# Patient Record
Sex: Female | Born: 1975 | Race: Black or African American | Hispanic: No | Marital: Married | Smoking: Never smoker
Health system: Southern US, Community
[De-identification: ages and names within clinical notes are randomized; demographics above are authoritative.]

## PROBLEM LIST (undated history)

## (undated) DIAGNOSIS — R011 Cardiac murmur, unspecified: Secondary | ICD-10-CM

## (undated) DIAGNOSIS — D569 Thalassemia, unspecified: Secondary | ICD-10-CM

## (undated) DIAGNOSIS — N200 Calculus of kidney: Secondary | ICD-10-CM

## (undated) DIAGNOSIS — E063 Autoimmune thyroiditis: Secondary | ICD-10-CM

---

## 2007-10-01 LAB — CONVERTED CEMR LAB: Pap Smear: NORMAL

## 2009-01-13 ENCOUNTER — Ambulatory Visit: Payer: Self-pay | Admitting: Internal Medicine

## 2009-01-13 DIAGNOSIS — I10 Essential (primary) hypertension: Secondary | ICD-10-CM | POA: Insufficient documentation

## 2009-01-13 DIAGNOSIS — E739 Lactose intolerance, unspecified: Secondary | ICD-10-CM

## 2009-01-13 DIAGNOSIS — D649 Anemia, unspecified: Secondary | ICD-10-CM

## 2009-01-13 DIAGNOSIS — F329 Major depressive disorder, single episode, unspecified: Secondary | ICD-10-CM

## 2009-01-13 DIAGNOSIS — E039 Hypothyroidism, unspecified: Secondary | ICD-10-CM | POA: Insufficient documentation

## 2009-01-13 DIAGNOSIS — Z87442 Personal history of urinary calculi: Secondary | ICD-10-CM

## 2009-01-13 LAB — CONVERTED CEMR LAB
ALT: 15 units/L (ref 0–35)
AST: 20 units/L (ref 0–37)
Albumin: 3.8 g/dL (ref 3.5–5.2)
Alkaline Phosphatase: 66 units/L (ref 39–117)
BUN: 3 mg/dL — ABNORMAL LOW (ref 6–23)
Basophils Absolute: 0 10*3/uL (ref 0.0–0.1)
Basophils Relative: 0.6 % (ref 0.0–3.0)
Bilirubin Urine: NEGATIVE
Bilirubin, Direct: 0.1 mg/dL (ref 0.0–0.3)
CO2: 27 meq/L (ref 19–32)
Calcium: 8.9 mg/dL (ref 8.4–10.5)
Chloride: 105 meq/L (ref 96–112)
Cholesterol: 204 mg/dL — ABNORMAL HIGH (ref 0–200)
Creatinine, Ser: 0.5 mg/dL (ref 0.4–1.2)
Direct LDL: 123.3 mg/dL
Eosinophils Absolute: 0.1 10*3/uL (ref 0.0–0.7)
Eosinophils Relative: 2.2 % (ref 0.0–5.0)
GFR calc non Af Amer: 152.63 mL/min (ref >60)
Glucose, Bld: 87 mg/dL (ref 70–99)
HCT: 37.9 % (ref 36.0–46.0)
HDL: 58.6 mg/dL (ref >39.00)
Hemoglobin: 12.4 g/dL (ref 12.0–15.0)
Ketones, ur: NEGATIVE mg/dL
Leukocytes, UA: NEGATIVE
Lymphocytes Relative: 33.2 % (ref 12.0–46.0)
Lymphs Abs: 2 10*3/uL (ref 0.7–4.0)
MCHC: 32.8 g/dL (ref 30.0–36.0)
MCV: 71.2 fL — ABNORMAL LOW (ref 78.0–100.0)
Monocytes Absolute: 0.4 10*3/uL (ref 0.1–1.0)
Monocytes Relative: 6.4 % (ref 3.0–12.0)
Neutro Abs: 3.5 10*3/uL (ref 1.4–7.7)
Neutrophils Relative %: 57.6 % (ref 43.0–77.0)
Nitrite: NEGATIVE
Platelets: 251 10*3/uL (ref 150.0–400.0)
Potassium: 3.6 meq/L (ref 3.5–5.1)
RBC: 5.32 M/uL — ABNORMAL HIGH (ref 3.87–5.11)
RDW: 13.8 % (ref 11.5–14.6)
Sodium: 142 meq/L (ref 135–145)
Specific Gravity, Urine: 1.01 (ref 1.000–1.030)
TSH: 0.31 microintl units/mL — ABNORMAL LOW (ref 0.35–5.50)
Total Bilirubin: 0.8 mg/dL (ref 0.3–1.2)
Total CHOL/HDL Ratio: 3
Total Protein, Urine: NEGATIVE mg/dL
Total Protein: 7.4 g/dL (ref 6.0–8.3)
Triglycerides: 95 mg/dL (ref 0.0–149.0)
Urine Glucose: NEGATIVE mg/dL
Urobilinogen, UA: 0.2 (ref 0.0–1.0)
VLDL: 19 mg/dL (ref 0.0–40.0)
WBC: 6 10*3/uL (ref 4.5–10.5)
pH: 7.5 (ref 5.0–8.0)

## 2009-01-15 ENCOUNTER — Telehealth: Payer: Self-pay | Admitting: Internal Medicine

## 2009-02-02 ENCOUNTER — Ambulatory Visit: Payer: Self-pay | Admitting: Endocrinology

## 2009-02-02 DIAGNOSIS — E042 Nontoxic multinodular goiter: Secondary | ICD-10-CM

## 2009-02-02 DIAGNOSIS — E0590 Thyrotoxicosis, unspecified without thyrotoxic crisis or storm: Secondary | ICD-10-CM | POA: Insufficient documentation

## 2009-02-24 ENCOUNTER — Telehealth (INDEPENDENT_AMBULATORY_CARE_PROVIDER_SITE_OTHER): Payer: Self-pay | Admitting: *Deleted

## 2009-02-25 ENCOUNTER — Encounter (INDEPENDENT_AMBULATORY_CARE_PROVIDER_SITE_OTHER): Payer: Self-pay | Admitting: *Deleted

## 2010-02-05 ENCOUNTER — Inpatient Hospital Stay (HOSPITAL_COMMUNITY)
Admission: AD | Admit: 2010-02-05 | Discharge: 2010-02-05 | Payer: Self-pay | Source: Home / Self Care | Attending: Obstetrics and Gynecology | Admitting: Obstetrics and Gynecology

## 2010-02-14 LAB — CBC
HCT: 36.9 % (ref 36.0–46.0)
Hemoglobin: 11.5 g/dL — ABNORMAL LOW (ref 12.0–15.0)
MCH: 21.2 pg — ABNORMAL LOW (ref 26.0–34.0)
MCHC: 31.2 g/dL (ref 30.0–36.0)
MCV: 68 fL — ABNORMAL LOW (ref 78.0–100.0)
Platelets: 274 10*3/uL (ref 150–400)
RBC: 5.43 MIL/uL — ABNORMAL HIGH (ref 3.87–5.11)
RDW: 14.7 % (ref 11.5–15.5)
WBC: 8.5 10*3/uL (ref 4.0–10.5)

## 2010-02-14 LAB — URINALYSIS, ROUTINE W REFLEX MICROSCOPIC
Bilirubin Urine: NEGATIVE
Ketones, ur: NEGATIVE mg/dL
Leukocytes, UA: NEGATIVE
Nitrite: NEGATIVE
Protein, ur: NEGATIVE mg/dL
Specific Gravity, Urine: 1.015 (ref 1.005–1.030)
Urine Glucose, Fasting: NEGATIVE mg/dL
Urobilinogen, UA: 0.2 mg/dL (ref 0.0–1.0)
pH: 7.5 (ref 5.0–8.0)

## 2010-02-14 LAB — URINE MICROSCOPIC-ADD ON

## 2010-02-14 LAB — HCG, QUANTITATIVE, PREGNANCY: hCG, Beta Chain, Quant, S: 45836 m[IU]/mL — ABNORMAL HIGH (ref <5)

## 2010-02-14 LAB — GC/CHLAMYDIA PROBE AMP, GENITAL
Chlamydia, DNA Probe: NEGATIVE
GC Probe Amp, Genital: NEGATIVE

## 2010-02-14 LAB — ABO/RH: ABO/RH(D): O POS

## 2010-02-14 LAB — WET PREP, GENITAL
Trich, Wet Prep: NONE SEEN
Yeast Wet Prep HPF POC: NONE SEEN

## 2010-02-14 LAB — POCT PREGNANCY, URINE: Preg Test, Ur: POSITIVE

## 2010-03-01 NOTE — Progress Notes (Signed)
Summary: No-show  Phone Note Other Incoming   Caller: [facility] Radiology [phone] Summary of Call: pt had appt to have Nuclear Medicine Thyroid 24hr Uptake done 02/23/2009 but no-showed. would you like me call pt to re-schedule? Initial call taken by: Margaret Pyle, CMA,  February 24, 2009 4:18 PM  Follow-up for Phone Call        yes, please reschedule Follow-up by: Minus Breeding MD,  February 24, 2009 7:38 PM  Additional Follow-up for Phone Call Additional follow up Details #1::        Appt schedule for jan 31@1 :00mon and tue  feb 1 @ 1:00 can not leave a msg on pt phone  [phone] mail box is full mail out letter  Additional Follow-up by: Shelbie Proctor,  February 25, 2009 8:28 AM

## 2010-03-01 NOTE — Assessment & Plan Note (Signed)
Summary: NEW ENDO CON/ HYPOTHYROID/NWS   Vital Signs:  Patient profile:   35 year old female Height:      62 inches (157.48 cm) Weight:      152 pounds (69.09 kg) O2 Sat:      99 % on Room air Temp:     97.1 degrees F (36.17 degrees C) oral Pulse rate:   76 / minute BP sitting:   118 / 80  (left arm) Cuff size:   regular  Vitals Entered By: Josph Macho CMA (February 02, 2009 2:13 PM)  O2 Flow:  Room air CC: New Endo: Hypothyroidism/ CF Is Patient Diabetic? No   CC:  New Endo: Hypothyroidism/ CF.  History of Present Illness: pt states she was dx'ed with hyperthyroidism and a goiter in 2006, when she live in [place].  she declines i-131 then.   symptomatically, pt states few years of sensation of a slight swelling of the neck, and intermittent associated pain.    Current Medications (verified): 1)  None  Allergies (verified): No Known Drug Allergies  Past History:  Past Medical History: Last updated: 01/13/2009 Anemia-NOS - thallasemia beta minor Depression Hypertension Nephrolithiasis, hx of hashimoto's thyroidisits/goiter glucose intolerance ? arythmia  Family History: Reviewed history from 01/13/2009 and no changes required. mother with DJD, elevated chol father with HTN and elevated cholesterol grandparent s with DJD, and HTN m-aunt with DM, HTN sister ded with lymphoma at 21yo sister with epilepsy aunt had a goiter.  Social History: Reviewed history from 01/13/2009 and no changes required. Married 3 children work - Public house manager , working as Social research officer, government  Never Smoked Alcohol use-yes  Review of Systems       The patient complains of weight gain.         denies double vision, sob, diarrhea, polyuria, muscle weakness, excessive diaphoresis, numbness, hypoglycemia, bruising, rhinorrhea.  she has intermittent headaches and deepening of the voice.  she has intermittent palpitations, and tremor of the hands.  she has slight anxiety and  depression.   Physical Exam  General:  normal appearance.   Head:  head: no deformity eyes: no periorbital swelling, no proptosis external nose and ears are normal mouth: no lesion seen  Neck:  there is an approx 15 mm left upper lobe thyroid nodule.  hte rest of the thyroid appears to be normal. Lungs:  Clear to auscultation bilaterally. Normal respiratory effort.  Heart:  Regular rate and rhythm without murmurs or gallops noted. Normal S1,S2.   Msk:  muscle bulk and strength are grossly normal.  no obvious joint swelling.  gait is normal and steady  Extremities:  no deformity trace right pedal edema and trace left pedal edema.   Neurologic:  cn 2-12 grossly intact.   readily moves all 4's.   sensation is intact to touch on all 4's Skin:  normal texture and temp.  no rash.  not diaphoretic  Cervical Nodes:  No significant adenopathy.  Psych:  Alert and cooperative; normal mood and affect; normal attention span and concentration.     Impression & Recommendations:  Problem # 1:  GOITER, MULTINODULAR (ICD-241.1) usually hereditary  Problem # 2:  HYPERTHYROIDISM (ICD-242.90) mild.  due to #1.  Problem # 3:  neck pain not thyroid-related  Other Orders: Radiology Referral (Radiology) Radiology Referral (Radiology) Consultation Level IV [phone])  Patient Instructions: 1)  we discussed the natural hx of nodular thyroid disease ("lumpy thyroid"), which is usually the slow development of hyperthyroidism (overactive thyroid). 2)  checdk  ultrasound and thyroid scan (a special type of thyroid x-ray). 3)  tests are being ordered for you today.  a few days after the test(s), please call [phone] to hear your test results.

## 2010-03-01 NOTE — Letter (Signed)
Summary: [facility] Consult Scheduled Letter  [facility]  [address] [place], [place] [address]   Phone: [phone]  Fax: [phone]      02/25/2009 MRN: [id number]  [facility] MUTHUI-GRAINGER [address] [place], [place]  [address]    Dear Ms. MUTHUI-GRAINGER,      We have scheduled an appointment for you. At the recommendation of Dr.John, we have scheduled you a consult with [facility]) Thyroid neuclear med uptake and scan on January 31&Febuary 1 Both appt at 1:00pm Their phone number is [phone].If this appointment day and time is not convenient for you, please feel free to call the office of the doctor you are being referred to at the number listed above and reschedule the appointment.  Radiology 1st floor  [address] [place], [place] [address]  Thank you,  Patient Care Coordinator [facility]

## 2011-07-12 ENCOUNTER — Other Ambulatory Visit: Payer: Self-pay | Admitting: Endocrinology

## 2011-07-12 DIAGNOSIS — E0590 Thyrotoxicosis, unspecified without thyrotoxic crisis or storm: Secondary | ICD-10-CM

## 2011-07-21 ENCOUNTER — Ambulatory Visit
Admission: RE | Admit: 2011-07-21 | Discharge: 2011-07-21 | Disposition: A | Discharge status: Home / Self Care | Payer: Medicaid Other | Source: Ambulatory Visit | Attending: Endocrinology | Admitting: Endocrinology

## 2011-07-21 DIAGNOSIS — E0590 Thyrotoxicosis, unspecified without thyrotoxic crisis or storm: Secondary | ICD-10-CM

## 2012-02-11 ENCOUNTER — Encounter (HOSPITAL_COMMUNITY): Payer: Self-pay | Admitting: Emergency Medicine

## 2012-02-11 ENCOUNTER — Emergency Department (INDEPENDENT_AMBULATORY_CARE_PROVIDER_SITE_OTHER)
Admission: EM | Admit: 2012-02-11 | Discharge: 2012-02-11 | Disposition: A | Discharge status: Home / Self Care | Payer: Self-pay | Source: Home / Self Care | Attending: Emergency Medicine | Admitting: Emergency Medicine

## 2012-02-11 DIAGNOSIS — K047 Periapical abscess without sinus: Secondary | ICD-10-CM

## 2012-02-11 HISTORY — DX: Cardiac murmur, unspecified: R01.1

## 2012-02-11 HISTORY — DX: Autoimmune thyroiditis: E06.3

## 2012-02-11 HISTORY — DX: Thalassemia, unspecified: D56.9

## 2012-02-11 HISTORY — DX: Calculus of kidney: N20.0

## 2012-02-11 MED ORDER — CLINDAMYCIN HCL 300 MG PO CAPS: 300.0000 mg | ORAL_CAPSULE | Freq: Four times a day (QID) | ORAL | Status: DC

## 2012-02-11 MED ORDER — HYDROCODONE-ACETAMINOPHEN 5-325 MG PO TABS: ORAL_TABLET | ORAL | Status: DC

## 2012-02-11 NOTE — ED Provider Notes (Signed)
Chief Complaint  Patient presents with  . Jaw Pain    History of Present Illness:   The patient is a 37 year old female who has had a five-day history of pain in her left, lower first molar and swelling of her jaw. The tooth is cracked previously. She's not been to see a dentist about this. There's been no purulent drainage. There is swelling of the gingiva and of the jaw externally. She denies any fever, chills, or sweats. She's had no difficulty breathing or swallowing. It does hurt to chew on that side. She denies any chest pain or shortness of breath. There is no swelling in the neck.  Review of Systems:  Other than noted above, the patient denies any of the following symptoms: Systemic:  No fever, chills, sweats or weight loss. ENT:  No headache, ear ache, sore throat, nasal congestion, facial pain, or swelling. Lymphatic:  No adenopathy. Lungs:  No coughing, wheezing or shortness of breath.  PMFSH:  Past medical history, family history, social history, meds, and allergies were reviewed.  Physical Exam:   Vital signs:  BP 147/98  Pulse 91  Temp 99.3 F (37.4 C) (Oral)  Resp 19  SpO2 100% General:  Alert, oriented, in no distress. ENT:  TMs and canals normal.  Nasal mucosa normal. Mouth exam:  She has moderate swelling externally of the left lower jaw which is tender to palpation. Entirely her teeth are in fairly good repair with the exception of the left, lower first molar which is partially cracked indicated. There is swelling of the gingiva at the gingivobuccal sulcus. There is no collection of pus. No swelling of the floor the mouth. No swelling of the tongue the pharynx is clear and airway is widely patent. Neck:  No swelling or adenopathy. Lungs:  Breath sounds clear and equal bilaterally.  No wheezes, rales or rhonchi. Heart:  Regular rhythm.  No gallops or murmers. Skin:  Clear, warm and dry.  Assessment:  The encounter diagnosis was Dental abscess.  Plan:   1.  The  following meds were prescribed:   New Prescriptions   CLINDAMYCIN (CLEOCIN) 300 MG CAPSULE    Take 1 capsule (300 mg total) by mouth 4 (four) times daily.   HYDROCODONE-ACETAMINOPHEN (NORCO/VICODIN) 5-325 MG PER TABLET    1 to 2 tabs every 4 to 6 hours as needed for pain.   2.  The patient was instructed in symptomatic care and handouts were given. 3.  The patient was told to return if becoming worse in any way, if no better in 3 or 4 days, and given some red flag symptoms that would indicate earlier return, especially difficulty breathing. 4.  The patient was told to follow up with a dentist as soon as possible.    Reuben Likes, MD 02/11/12 [phone]

## 2012-02-11 NOTE — ED Notes (Signed)
Provided warm blanket for face and body

## 2012-02-11 NOTE — ED Notes (Signed)
Patient currently is breast feeding

## 2012-02-11 NOTE — ED Notes (Signed)
Reports left jaw pain, swelling that started 3 days ago.  No tooth pain, but is aware of a cracked tooth on bottom, left and feels it is related.

## 2012-03-16 ENCOUNTER — Other Ambulatory Visit: Payer: Self-pay

## 2012-07-31 ENCOUNTER — Other Ambulatory Visit: Payer: Self-pay | Admitting: Endocrinology

## 2012-07-31 DIAGNOSIS — E049 Nontoxic goiter, unspecified: Secondary | ICD-10-CM

## 2012-08-12 ENCOUNTER — Ambulatory Visit
Admission: RE | Admit: 2012-08-12 | Discharge: 2012-08-12 | Disposition: A | Discharge status: Home / Self Care | Payer: Medicaid Other | Source: Ambulatory Visit | Attending: Endocrinology | Admitting: Endocrinology

## 2012-08-12 DIAGNOSIS — E049 Nontoxic goiter, unspecified: Secondary | ICD-10-CM

## 2012-08-15 ENCOUNTER — Other Ambulatory Visit: Payer: Self-pay | Admitting: Endocrinology

## 2012-08-15 DIAGNOSIS — E041 Nontoxic single thyroid nodule: Secondary | ICD-10-CM

## 2012-08-22 ENCOUNTER — Other Ambulatory Visit (HOSPITAL_COMMUNITY)
Admission: RE | Admit: 2012-08-22 | Discharge: 2012-08-22 | Disposition: A | Discharge status: Home / Self Care | Payer: Medicaid Other | Source: Ambulatory Visit | Attending: Endocrinology | Admitting: Endocrinology

## 2012-08-22 ENCOUNTER — Ambulatory Visit
Admission: RE | Admit: 2012-08-22 | Discharge: 2012-08-22 | Disposition: A | Discharge status: Home / Self Care | Payer: Medicaid Other | Source: Ambulatory Visit | Attending: Endocrinology | Admitting: Endocrinology

## 2012-08-22 DIAGNOSIS — E049 Nontoxic goiter, unspecified: Secondary | ICD-10-CM | POA: Insufficient documentation

## 2012-08-22 DIAGNOSIS — E041 Nontoxic single thyroid nodule: Secondary | ICD-10-CM

## 2012-12-05 ENCOUNTER — Other Ambulatory Visit: Payer: Self-pay

## 2016-07-04 ENCOUNTER — Telehealth: Payer: Self-pay | Admitting: Internal Medicine

## 2016-07-04 NOTE — Telephone Encounter (Signed)
Called to cancel referral for patient to come to endocrinology. No further action needed.

## 2016-10-27 ENCOUNTER — Ambulatory Visit
Admission: RE | Admit: 2016-10-27 | Discharge: 2016-10-27 | Disposition: A | Discharge status: Home / Self Care | Payer: PRIVATE HEALTH INSURANCE | Source: Ambulatory Visit | Attending: Family Medicine | Admitting: Family Medicine

## 2016-10-27 ENCOUNTER — Other Ambulatory Visit: Payer: Self-pay | Admitting: Family Medicine

## 2016-10-27 DIAGNOSIS — M542 Cervicalgia: Secondary | ICD-10-CM

## 2016-11-02 ENCOUNTER — Other Ambulatory Visit: Payer: Self-pay | Admitting: Endocrinology

## 2016-11-02 DIAGNOSIS — E041 Nontoxic single thyroid nodule: Secondary | ICD-10-CM

## 2016-11-10 ENCOUNTER — Other Ambulatory Visit: Payer: PRIVATE HEALTH INSURANCE

## 2016-11-13 ENCOUNTER — Ambulatory Visit
Admission: RE | Admit: 2016-11-13 | Discharge: 2016-11-13 | Disposition: A | Discharge status: Home / Self Care | Payer: PRIVATE HEALTH INSURANCE | Source: Ambulatory Visit | Attending: Endocrinology | Admitting: Endocrinology

## 2016-11-13 DIAGNOSIS — E041 Nontoxic single thyroid nodule: Secondary | ICD-10-CM

## 2016-12-30 ENCOUNTER — Emergency Department (HOSPITAL_COMMUNITY)
Admission: EM | Admit: 2016-12-30 | Discharge: 2016-12-30 | Disposition: A | Discharge status: Home / Self Care | Payer: PRIVATE HEALTH INSURANCE | Attending: Emergency Medicine | Admitting: Emergency Medicine

## 2016-12-30 ENCOUNTER — Encounter (HOSPITAL_COMMUNITY): Payer: Self-pay | Admitting: Nurse Practitioner

## 2016-12-30 ENCOUNTER — Emergency Department (HOSPITAL_COMMUNITY): Payer: PRIVATE HEALTH INSURANCE

## 2016-12-30 DIAGNOSIS — I10 Essential (primary) hypertension: Secondary | ICD-10-CM | POA: Diagnosis not present

## 2016-12-30 DIAGNOSIS — Z79899 Other long term (current) drug therapy: Secondary | ICD-10-CM | POA: Insufficient documentation

## 2016-12-30 DIAGNOSIS — R109 Unspecified abdominal pain: Secondary | ICD-10-CM

## 2016-12-30 DIAGNOSIS — E039 Hypothyroidism, unspecified: Secondary | ICD-10-CM | POA: Diagnosis not present

## 2016-12-30 DIAGNOSIS — R338 Other retention of urine: Secondary | ICD-10-CM | POA: Diagnosis not present

## 2016-12-30 DIAGNOSIS — R1012 Left upper quadrant pain: Secondary | ICD-10-CM | POA: Diagnosis present

## 2016-12-30 LAB — URINALYSIS, ROUTINE W REFLEX MICROSCOPIC
Bilirubin Urine: NEGATIVE
Glucose, UA: NEGATIVE mg/dL
Hgb urine dipstick: NEGATIVE
Ketones, ur: NEGATIVE mg/dL
Leukocytes, UA: NEGATIVE
Nitrite: NEGATIVE
Protein, ur: NEGATIVE mg/dL
Specific Gravity, Urine: 1.014 (ref 1.005–1.030)
pH: 6 (ref 5.0–8.0)

## 2016-12-30 MED ORDER — KETOROLAC TROMETHAMINE 30 MG/ML IJ SOLN
30.0000 mg | Freq: Once | INTRAMUSCULAR | Status: AC
  Administered 2016-12-30: 30 mg via INTRAVENOUS
  Filled 2016-12-30: qty 1

## 2016-12-30 NOTE — ED Notes (Signed)
Pt unable to wait for bladder scan d/t pain.  Pt with hx of kidney stones.

## 2016-12-30 NOTE — ED Provider Notes (Signed)
[facility]-EMERGENCY DEPT Provider Note   CSN: [id number] Arrival date & time: 12/30/16  0203  Time seen 03:50 AM  History   Chief Complaint Chief Complaint  Patient presents with  . Post-op Problem  . Urinary Retention    HPI Kristy Garner is a 41 y.o. female.  HPI patient reports she had a total vaginal hysterectomy, and repair of a cystocele, rectocele, and uterine prolapse done on [facility].  She reports she was discharged about 3:30 PM in the afternoon of November 30.  She states that at that time she noted she was just dribbling urine and felt like she was not emptying her bladder.  She states she then could not urinate at all.  She also is complaining of severe left flank pain that was not helped with Dilaudid oral pills.  She states she did have abdominal bloating but denies nausea, vomiting, or hematuria.  She has never had to have a catheter before.  She states she has had multiple kidney stones that she has passed before.  She thinks the flank pain may be a kidney stone.  She states she routinely passes them and passed to within the past few months.  [facility].  PCP Hoyt KochYousef, Deema, MD GYN Dr Lavella Hammockatherine Matthews [facility]  Past Medical History:  Diagnosis Date  . Hashimoto's thyroiditis   . Heart murmur   . Kidney stones   . Thalassemia     Patient Active Problem List   Diagnosis Date Noted  . GOITER, MULTINODULAR 02/02/2009  . HYPERTHYROIDISM 02/02/2009  . HYPOTHYROIDISM 01/13/2009  . GLUCOSE INTOLERANCE 01/13/2009  . ANEMIA-NOS 01/13/2009  . DEPRESSION 01/13/2009  . HYPERTENSION 01/13/2009  . NEPHROLITHIASIS, HX OF 01/13/2009    History reviewed. No pertinent surgical history.  OB History    No data available       Home Medications    Prior to Admission medications   Medication Sig Start Date End Date Taking? Authorizing Provider  acetaminophen  (TYLENOL) 500 MG tablet Take 1,000 mg by mouth every 6 (six) hours as needed for mild pain, moderate pain or headache.   Yes [provider]  docusate sodium (COLACE) 100 MG capsule Take 100 mg by mouth 2 (two) times daily.   Yes [provider]  HYDROmorphone (DILAUDID) 2 MG tablet Take 2 mg by mouth every 4 (four) hours as needed for moderate pain or severe pain.   Yes [provider]  ibuprofen (ADVIL,MOTRIN) 800 MG tablet Take 800 mg by mouth every 8 (eight) hours as needed for headache, mild pain or moderate pain.   Yes [provider]  ondansetron (ZOFRAN-ODT) 4 MG disintegrating tablet Take 4 mg by mouth every 8 (eight) hours as needed for nausea or vomiting.   Yes [provider]    Family History History reviewed. No pertinent family history.  Social History Social History   Tobacco Use  . Smoking status: Never Smoker  . Smokeless tobacco: Never Used  Substance Use Topics  . Alcohol use: No  . Drug use: No  Pt is  RN at [employer]   Allergies   Ivp dye [iodinated diagnostic agents] and Penicillins   Review of Systems Review of Systems  All other systems reviewed and are negative.    Physical Exam Updated Vital Signs BP (!) 124/104 (BP Location: Right Arm)   Pulse 93   Temp 98.6 F (37 C) (Oral)  Resp 18   Ht 5\' 2"  (1.575 m)   Wt 60.3 kg (133 lb)   SpO2 97%   BMI 24.33 kg/m   Physical Exam  Constitutional: She is oriented to person, place, and time. She appears well-developed and well-nourished.  HENT:  Head: Normocephalic and atraumatic.  Right Ear: External ear normal.  Left Ear: External ear normal.  Nose: Nose normal.  Eyes: Conjunctivae and EOM are normal.  Neck: Normal range of motion.  Cardiovascular: Normal rate.  Pulmonary/Chest: Effort normal. No respiratory distress.  Abdominal: Soft.  Patient reports her abdomen feels much better after Foley placement.  Her abdomen was not palpated to extensively due to  her recent surgery.  Genitourinary:  Genitourinary Comments: Patient has left CVA tenderness.  Musculoskeletal: Normal range of motion. She exhibits no deformity.  Neurological: She is alert and oriented to person, place, and time. No cranial nerve deficit.  Skin: Skin is warm and dry. No erythema.  Psychiatric: She has a normal mood and affect. Her behavior is normal.  Nursing note and vitals reviewed.    ED Treatments / Results  Labs (all labs ordered are listed, but only abnormal results are displayed) Results for orders placed or performed during the hospital encounter of 12/30/16  Urinalysis, Routine w reflex microscopic  Result Value Ref Range   Color, Urine YELLOW YELLOW   APPearance CLEAR CLEAR   Specific Gravity, Urine 1.014 1.005 - 1.030   pH 6.0 5.0 - 8.0   Glucose, UA NEGATIVE NEGATIVE mg/dL   Hgb urine dipstick NEGATIVE NEGATIVE   Bilirubin Urine NEGATIVE NEGATIVE   Ketones, ur NEGATIVE NEGATIVE mg/dL   Protein, ur NEGATIVE NEGATIVE mg/dL   Nitrite NEGATIVE NEGATIVE   Leukocytes, UA NEGATIVE NEGATIVE   Laboratory interpretation all normal     EKG  EKG Interpretation None       Radiology Ct Renal Stone Study  Result Date: 12/30/2016 CLINICAL DATA:  Left flank pain. Hysterectomy with bladder tack 2 days prior. EXAM: CT ABDOMEN AND PELVIS WITHOUT CONTRAST TECHNIQUE: Multidetector CT imaging of the abdomen and pelvis was performed following the standard protocol without IV contrast. COMPARISON:  None. FINDINGS: Lower chest: The lung bases are clear. Hepatobiliary: No focal hepatic lesion allowing for lack contrast. Gallbladder physiologically distended, no calcified stone. No biliary dilatation. Pancreas: No ductal dilatation or inflammation. Spleen: Normal in size without focal abnormality. Adrenals/Urinary Tract: No adrenal nodule. No hydronephrosis or perinephric edema. Bilateral nonobstructing nephrolithiasis, largest stone on the left measures 8 mm, largest on  the right 5 mm. Simple cyst in the lower right kidney. Ureters are decompressed without stones along the course. Urinary bladder is decompressed by Foley catheter. Stomach/Bowel: Stomach is nondistended. No bowel wall thickening, inflammation or obstruction. Appendix is air-filled without periappendiceal inflammation. Moderate stool in the right colon, small volume of stool distally. Vascular/Lymphatic: No enlarged abdominal or pelvic lymph nodes. Normal caliber abdominal aorta. Reproductive: Post hysterectomy. Peroneal air related to recent hysterectomy. Minimal soft tissue prominence of the vaginal cuff. No evidence of pelvic abscess. Other: Air within the subcutaneous tissues of the anterior lower pelvis secondary to recent surgery. Small amount of free fluid in the pelvis without evidence of abscess. No tracking soft tissue air. Small umbilical hernia containing fat and minimal small bowel, no inflammation. Musculoskeletal: There are no acute or suspicious osseous abnormalities. IMPRESSION: 1. Bilateral nonobstructing renal stones without hydronephrosis or obstructive uropathy. Urinary bladder decompressed by Foley catheter. 2. Post recent hysterectomy with expected postsurgical change in the pelvis  and soft tissues. No evidence of abscess. Electronically Signed   By: Rubye OaksMelanie  Ehinger M.D.   On: 12/30/2016 05:27    Procedures Procedures (including critical care time)  Medications Ordered in ED Medications  ketorolac (TORADOL) 30 MG/ML injection 30 mg (30 mg Intravenous Given 12/30/16 0500)     Initial Impression / Assessment and Plan / ED Course  I have reviewed the triage vital signs and the nursing notes.  Pertinent labs & imaging results that were available during my care of the patient were reviewed by me and considered in my medical decision making (see chart for details).    Nurse reports she was unable to get a bladder scan done before patient reported she could not wait any longer.  A  Foley catheter was inserted and she had approximately 800 cc of urinary output.  Patient had relief of her lower abdominal discomfort other than her postsurgical pain.  She also was complaining of a lot of left flank pain and has a history of passing kidney stones.  CT renal was done and patient was given Toradol 30 mg IV.  Recheck at time of discharge patient is resting comfortably, she was given her test results.  She states her pain is improved after the Toradol.  She was discharged home with her Foley catheter.  She states her surgeons told her to follow-up with them later today by phone.  They are going to try to work her into the office hopefully on Monday.  Patient has pain medication to take at home.  Final Clinical Impressions(s) / ED Diagnoses   Final diagnoses:  Acute urinary retention  Left flank pain    Plan discharge  Devoria AlbeIva Jeff Frieden, MD, [facility]    Chiyoko Torrico, MD 12/30/16 [phone]

## 2016-12-30 NOTE — Discharge Instructions (Signed)
Recheck if your catheter stops draining or you have any further problems. [facility], if they want you to see a local urologist, you can call your Urologist, Dr Sabino GasserMullins, to get follow up.  You CT scan shows you do have some small renal stones, but you are not passing one tonight. Try ice and heat for comfort. Ask your doctors if you can take ibuprofen 600 mg 4 times a day for pain.

## 2016-12-30 NOTE — ED Triage Notes (Signed)
Pt reports she had an complete hysterectomy on Thursday,  She noticed today she has been having difficulty voiding but for the last 6 hours, she has experiencing severe pain and unable to void. She was advised to come for foley placement to help drain her bladder and offer some relief.

## 2016-12-30 NOTE — ED Notes (Signed)
IV attempt x2 without success.

## 2016-12-30 NOTE — ED Notes (Signed)
Pt requesting to wear large drainage bag home & take the leg bag to change out later.  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

## 2018-01-02 IMAGING — US US THYROID
1 series · 13 of 25 positions shown · non-contrast
Comparison: 08/12/2012;

CLINICAL DATA: Prior ultrasound follow-up. Follow-up thyroid
nodule. History of right-sided thyroid nodule fine-needle aspiration
on 08/22/2012

EXAM:
THYROID ULTRASOUND
TECHNIQUE: Ultrasound examination of the thyroid gland and adjacent soft
tissues was performed.

[Series 1: us thyroid · 0.06mm/px · 13 of 46 slices shown]
[im 1/46]
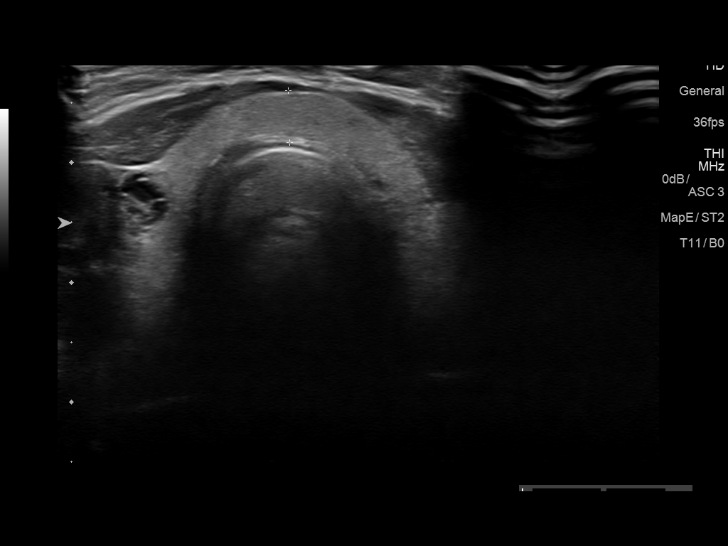
[im 4/46]
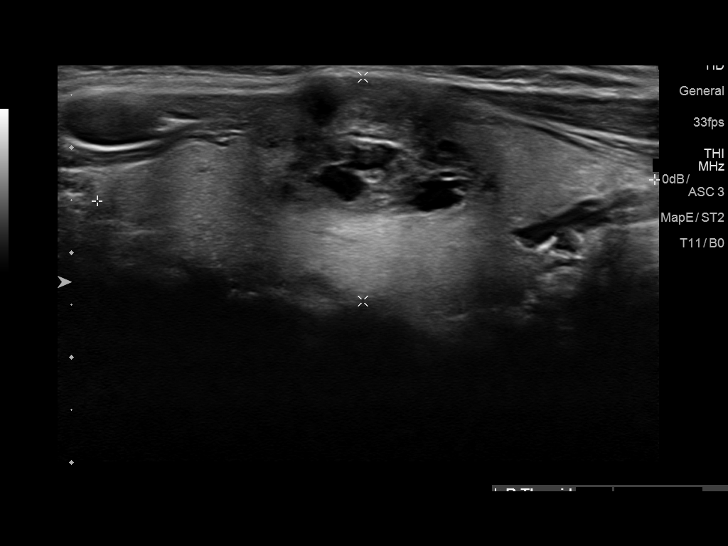
[im 8/46]
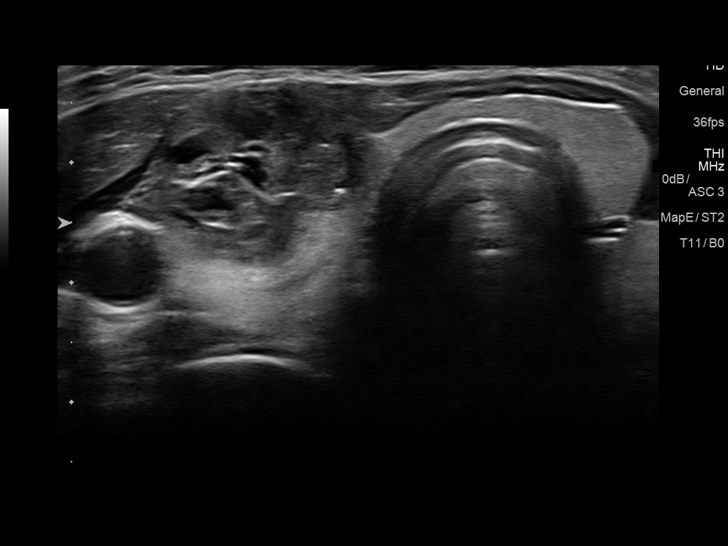
[im 12/46]
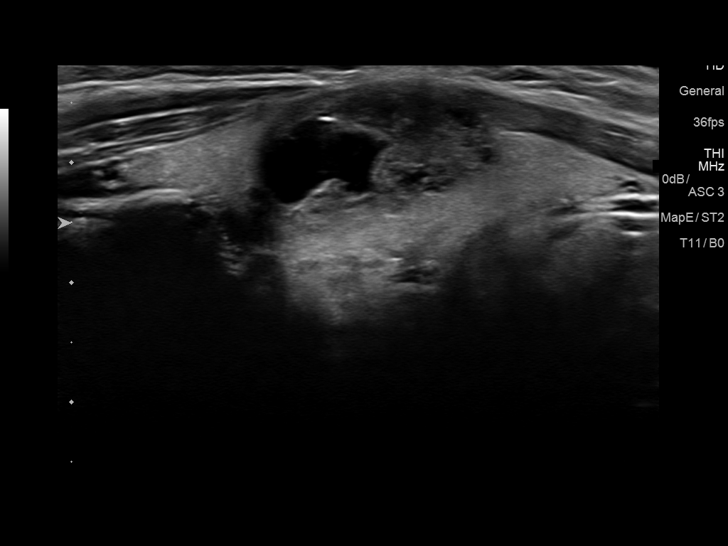
[im 16/46]
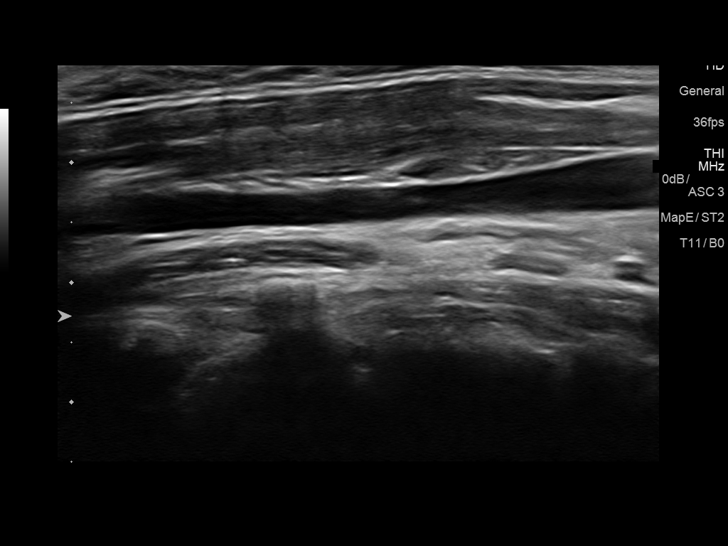
[im 19/46]
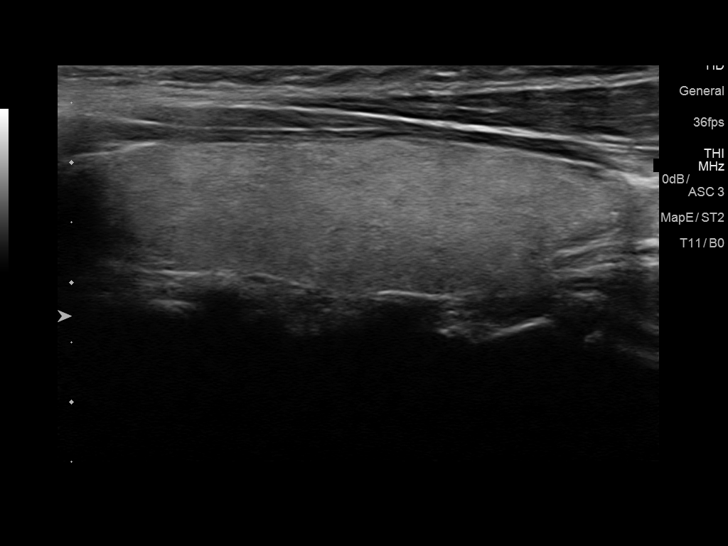
[im 23/46]
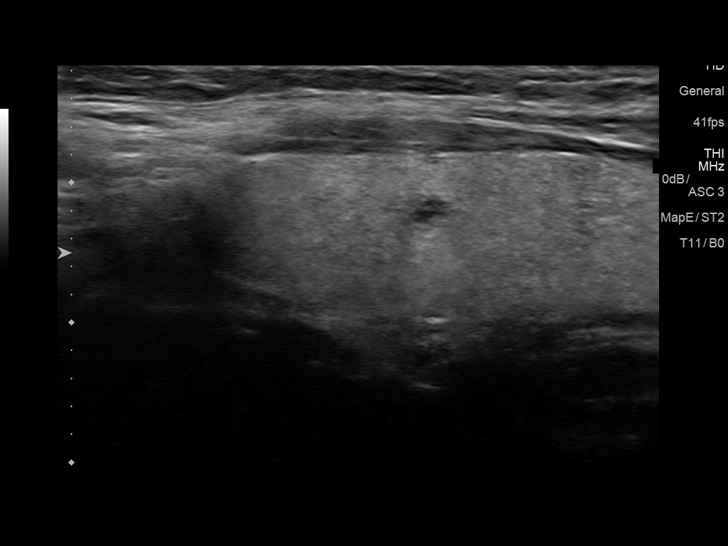
[im 27/46]
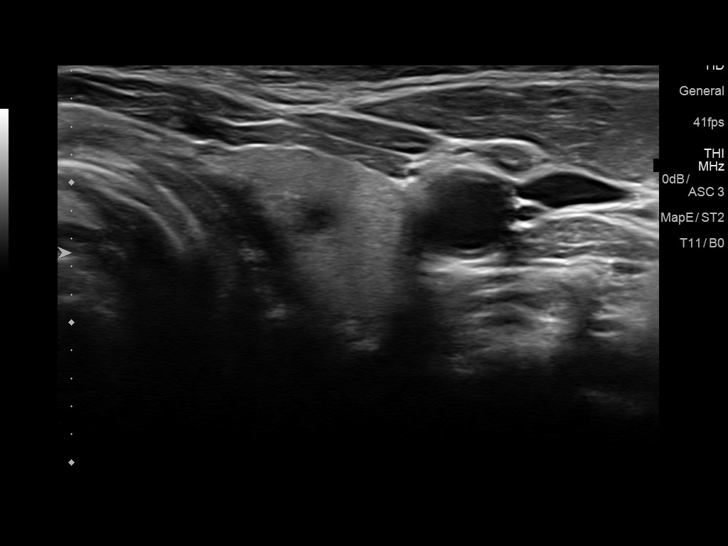
[im 31/46]
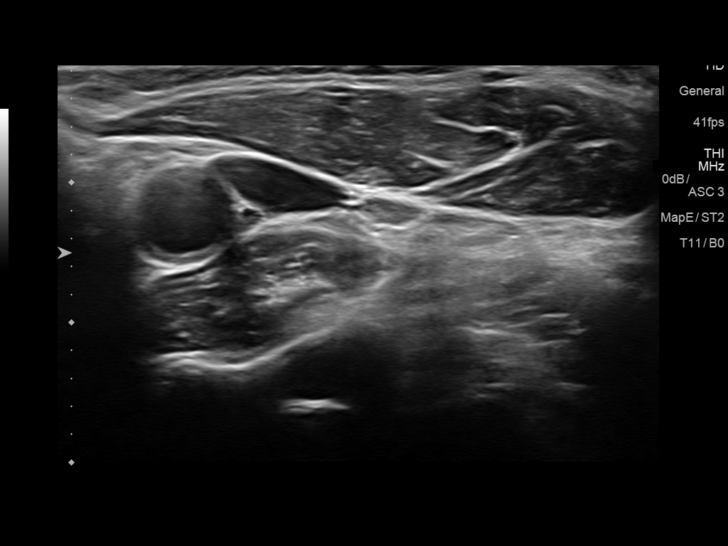
[im 34/46]
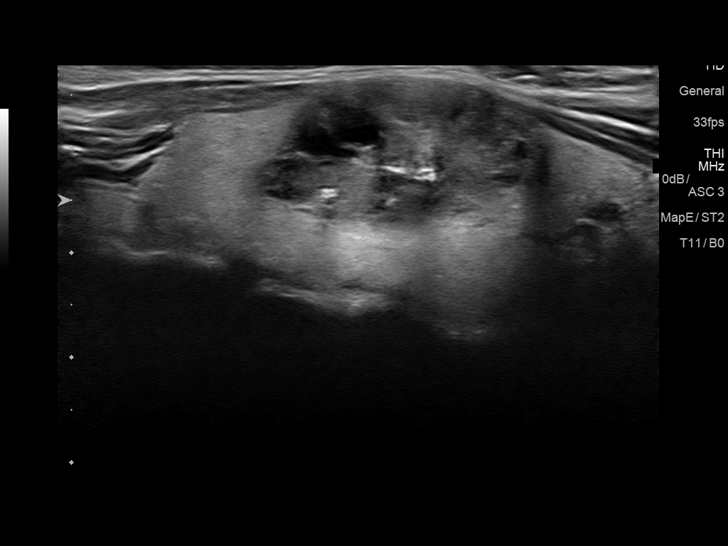
[im 38/46]
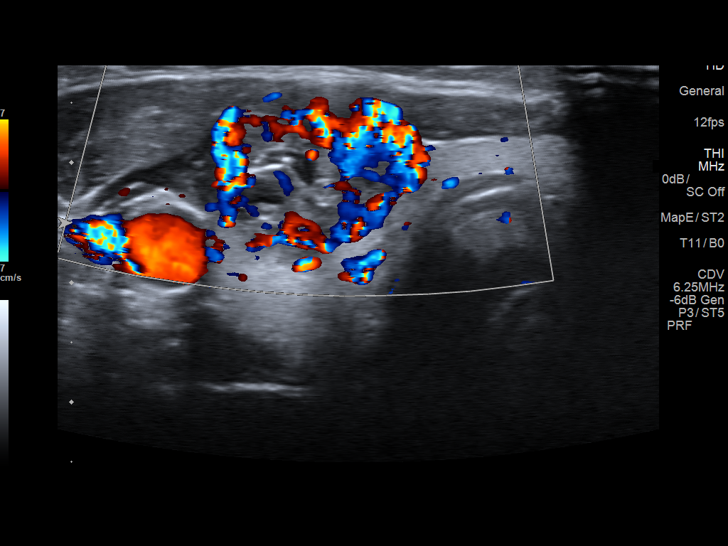
[im 42/46]
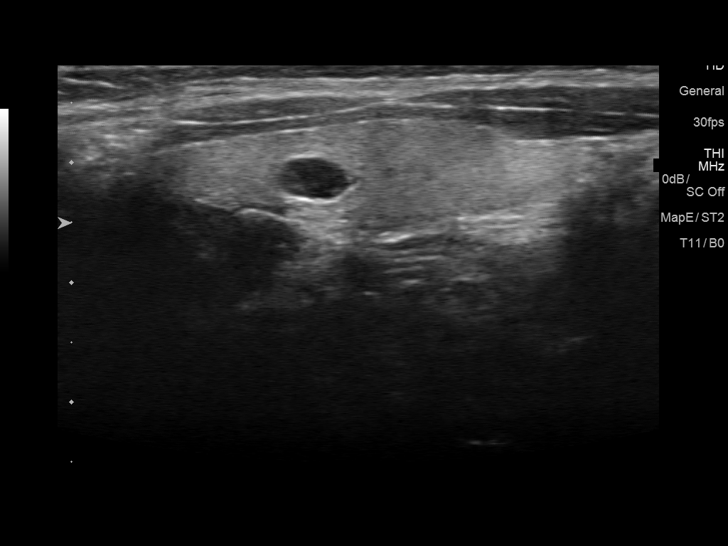
[im 46/46]
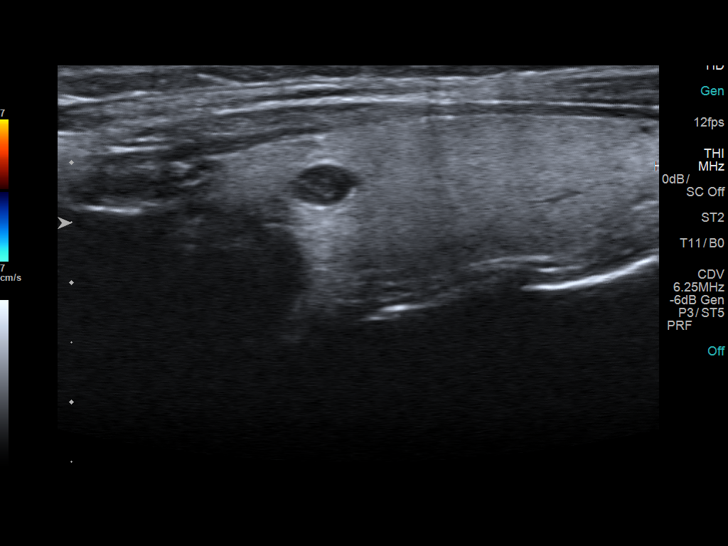

[13 of 25 positions shown; findings below may reference images not displayed]

07/21/2011; ultrasound-guided right-sided
thyroid nodule fine-needle aspiration - 08/22/2012
FINDINGS: Parenchymal Echotexture: Normal

Isthmus: Normal in size measures 0.4 cm in diameter, unchanged

Right lobe: Normal in size measuring 5.3 x 2.1 x 2.1 cm, unchanged,
previously, 4.4 x 1.9 x 2.4 cm

Left lobe: Normal in size measuring 4.6 x 1.6 x 1.3 cm, unchanged,
previously, 4.3 x 1.7 x 1.2 cm

_________________________________________________________

Estimated total number of nodules >/= 1 cm: 1

Number of spongiform nodules >/=  2 cm not described below (TR1): 0

Number of mixed cystic and solid nodules >/= 1.5 cm not described
below (TR2): 0

_________________________________________________________

The previously biopsied approximately 2.8 x 1.9 x 1.5 cm complex,
partially cystic, partially solid nodule within the superior pole
the right lobe of the thyroid has decreased in size compared to the
[DATE] examination, previously, 3.5 x 2.2 x 1.6 cm. Correlation
prior biopsy results is recommended.

There is a punctate (approximately 0.6 x 0.5 x 0.4 cm) anechoic cyst
within the superior, mid aspect the left lobe of the thyroid which
is unchanged compared to the [DATE] examination, previously, 0.6 x
0.6 x 0.3 cm.
IMPRESSION: 1. Similar findings of multinodular goiter. No new or enlarging
thyroid nodules.
2. The previously biopsied complex, partially cystic, partially
solid nodule within the right lobe of the thyroid has decreased in
size in the interval, currently measuring 2.8 cm, previously,
cm. Correlation with prior biopsy results is recommended. Assuming a
benign pathologic diagnosis, repeat sampling and/or continued
dedicated follow-up is not recommended.
The above is in keeping with the ACR TI-RADS recommendations - [HOSPITAL] 0706;[DATE].

## 2018-01-26 ENCOUNTER — Ambulatory Visit (HOSPITAL_COMMUNITY)
Admission: EM | Admit: 2018-01-26 | Discharge: 2018-01-26 | Disposition: A | Discharge status: Home / Self Care | Payer: PRIVATE HEALTH INSURANCE | Attending: Family Medicine | Admitting: Family Medicine

## 2018-01-26 ENCOUNTER — Encounter (HOSPITAL_COMMUNITY): Payer: Self-pay

## 2018-01-26 ENCOUNTER — Other Ambulatory Visit: Payer: Self-pay

## 2018-01-26 DIAGNOSIS — K0889 Other specified disorders of teeth and supporting structures: Secondary | ICD-10-CM | POA: Diagnosis not present

## 2018-01-26 MED ORDER — AMOXICILLIN 875 MG PO TABS: 875.0000 mg | ORAL_TABLET | Freq: Two times a day (BID) | ORAL | 0 refills | Status: AC

## 2018-01-26 NOTE — ED Provider Notes (Signed)
[facility]   [id number] 01/26/18 Arrival Time: 1037  ASSESSMENT & PLAN:  1. Pain, dental    No sign of abscess requiring I&D. Discussed.  Meds ordered this encounter  Medications  . amoxicillin (AMOXIL) 875 MG tablet    Sig: Take 1 tablet (875 mg total) by mouth 2 (two) times daily for 10 days.    Dispense:  20 tablet    Refill:  0   Ibuprofen with food as needed. She plans to f/u with dentist if needed.  Reviewed expectations re: course of current medical issues. Questions answered. Outlined signs and symptoms indicating need for more acute intervention. Patient verbalized understanding. After Visit Summary given.   SUBJECTIVE:  Kristy Garner is a 42 y.o. female who reports gradual onset of left lower dental pain described as "aching". Present for several days. Fever: absent. Tolerating PO intake but reports pain with chewing. Normal swallowing. She does not see a dentist regularly. No neck swelling or pain. OTC analgesics without relief.  ROS: As per HPI.  OBJECTIVE:  Vitals:   01/26/18 1114 01/26/18 1119  BP: 111/76   Pulse: 99   Resp: 18   Temp: 98.3 F (36.8 C)   TempSrc: Oral   SpO2: 100%   Weight:  59 kg    General appearance: alert; no distress HENT: normocephalic; atraumatic; dentition: fair; mild gingival erythema and tenderness over left lower gums near missing molars without areas of fluctuance Neck: supple without LAD; FROM; trachea midline Lungs: normal respirations; unlabored Skin: warm and dry Psychological: alert and cooperative; normal mood and affect  Allergies  Allergen Reactions  . Ivp Dye [Iodinated Diagnostic Agents]   . Penicillins Rash    Has patient had a PCN reaction causing immediate rash, facial/tongue/throat swelling, SOB or lightheadedness with hypotension: Yes Has patient had a PCN reaction causing severe rash involving mucus membranes or skin necrosis: No Has patient had a PCN reaction that required  hospitalization: No Has patient had a PCN reaction occurring within the last 10 years: No If all of the above answers are "NO", then may proceed with Cephalosporin use.     Past Medical History:  Diagnosis Date  . Hashimoto's thyroiditis   . Heart murmur   . Kidney stones   . Thalassemia    Social History   Socioeconomic History  . Marital status: Married    Spouse name: Not on file  . Number of children: Not on file  . Years of education: Not on file  . Highest education level: Not on file  Occupational History  . Not on file  Social Needs  . Financial resource strain: Not on file  . Food insecurity:    Worry: Not on file    Inability: Not on file  . Transportation needs:    Medical: Not on file    Non-medical: Not on file  Tobacco Use  . Smoking status: Never Smoker  . Smokeless tobacco: Never Used  Substance and Sexual Activity  . Alcohol use: No  . Drug use: No  . Sexual activity: Not on file  Lifestyle  . Physical activity:    Days per week: Not on file    Minutes per session: Not on file  . Stress: Not on file  Relationships  . Social connections:    Talks on phone: Not on file    Gets together: Not on file    Attends religious service: Not on file    Active member of club or organization: Not  on file    Attends meetings of clubs or organizations: Not on file    Relationship status: Not on file  . Intimate partner violence:    Fear of current or ex partner: Not on file    Emotionally abused: Not on file    Physically abused: Not on file    Forced sexual activity: Not on file  Other Topics Concern  . Not on file  Social History Narrative  . Not on file    History reviewed. No pertinent surgical history.   Mardella LaymanHagler, Meliana Canner, MD 01/26/18 1145

## 2018-01-26 NOTE — ED Triage Notes (Signed)
Pt cc she has a toothache. X 3 days.

## 2018-04-10 IMAGING — CR DG CERVICAL SPINE 2 OR 3 VIEWS
5 series · 5 of 5 positions shown · non-contrast
Comparison: None.

CLINICAL DATA: Right-sided neck pain for several hours, initial
encounter

EXAM:
CERVICAL SPINE - 2-3 VIEW

[w cervical spine lat]
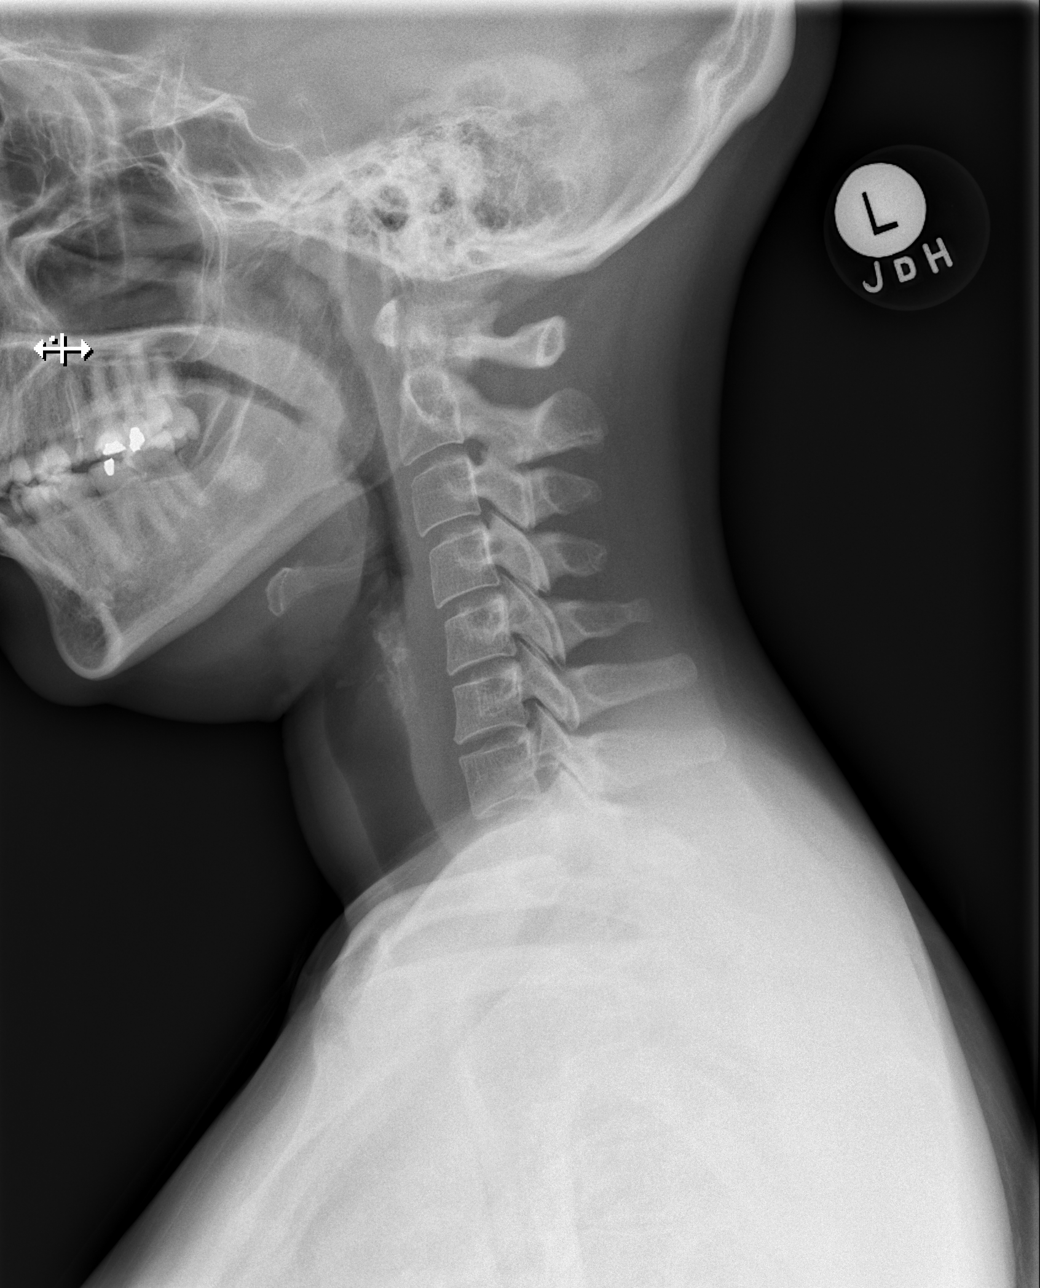

[w cervical swimmers]
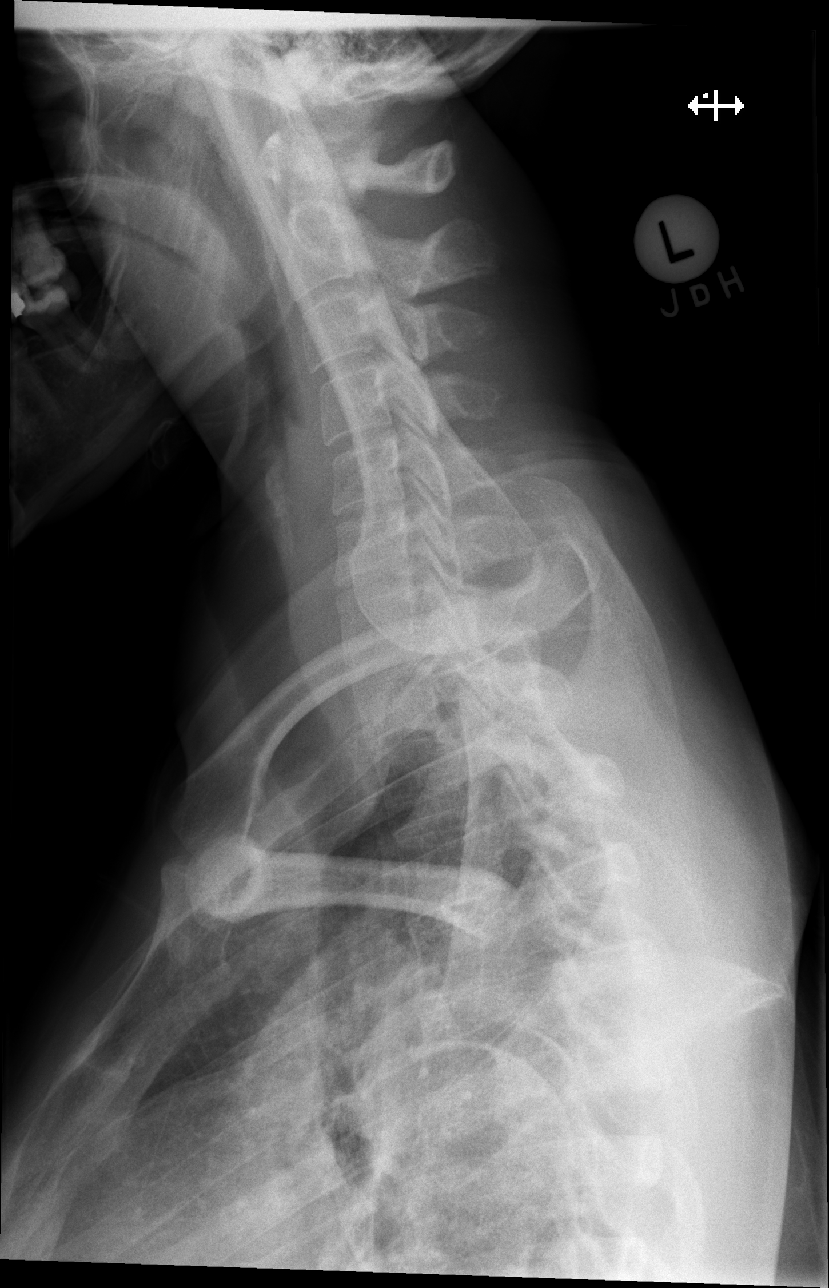

[w cervical spine ap]
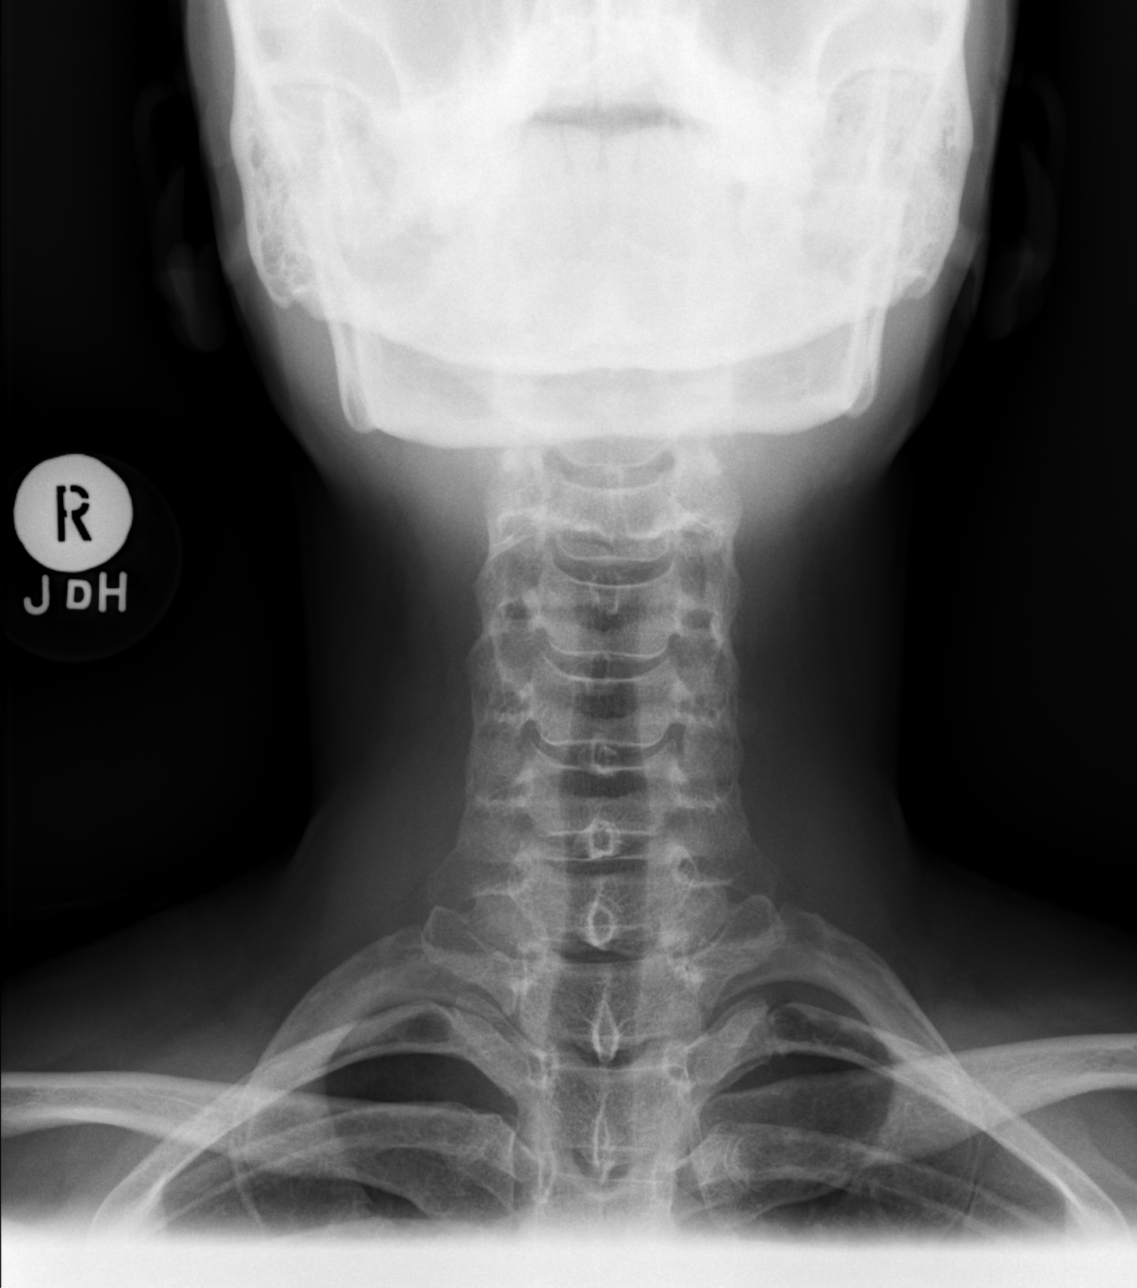

[w cervical spine odontoid (1 of 2)]
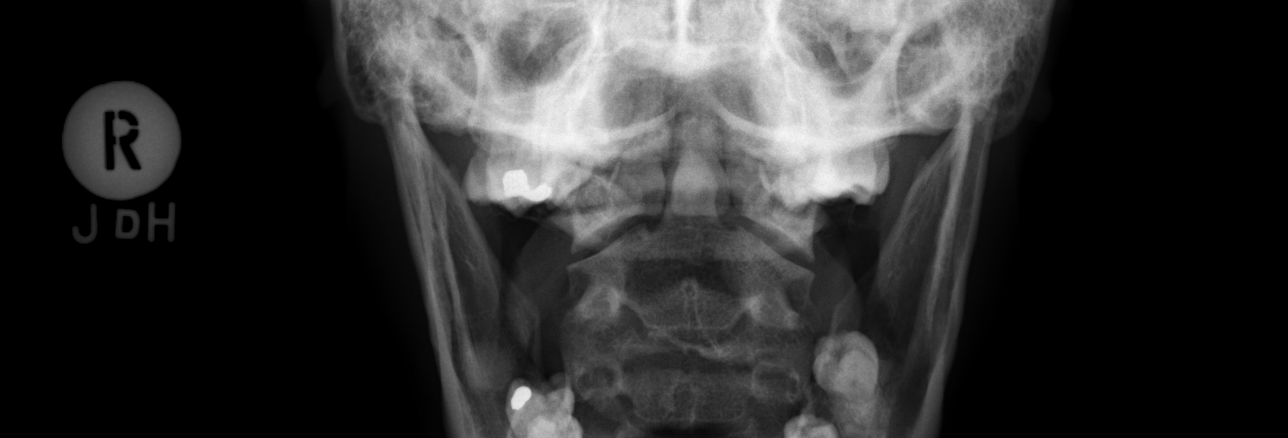

[w cervical spine odontoid (2 of 2)]
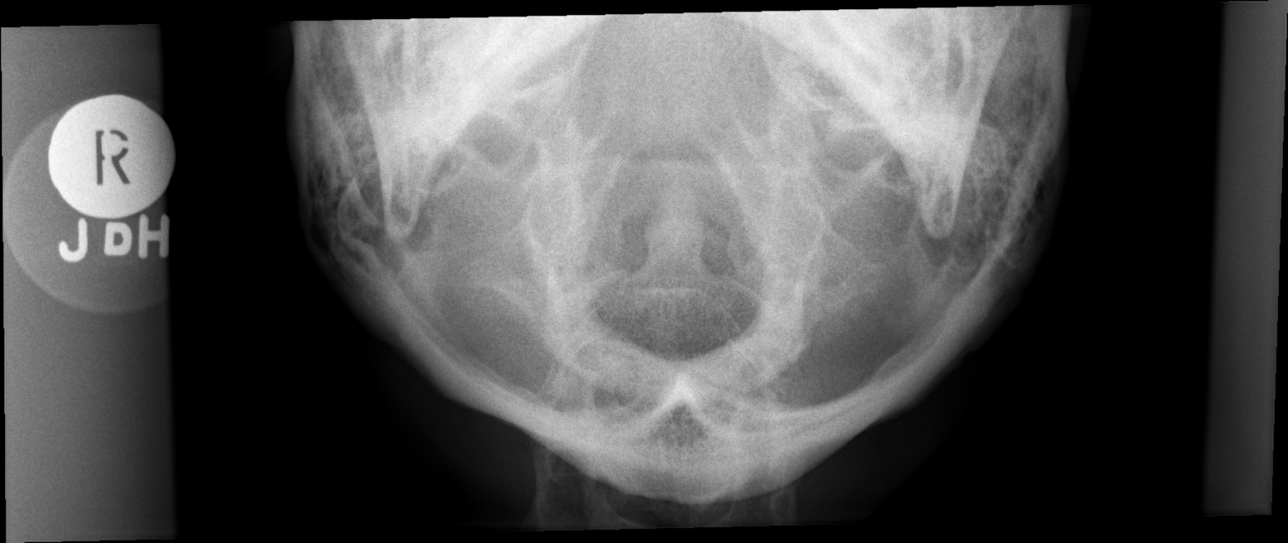

[5 of 5 positions shown; findings below may reference images not displayed]

FINDINGS: Seven cervical segments are well visualized. Vertebral body height
is well maintained. No acute fracture or acute facet abnormality is
noted. The odontoid is within normal limits. No soft tissue changes
are seen.
IMPRESSION: No acute abnormality noted.
